# Patient Record
Sex: Female | Born: 1985 | Race: Black or African American | Hispanic: No | Marital: Single | Smoking: Never smoker
Health system: Southern US, Community
[De-identification: ages and names within clinical notes are randomized; demographics above are authoritative.]

---

## 2016-11-02 ENCOUNTER — Inpatient Hospital Stay
Admit: 2016-11-02 | Discharge: 2016-11-02 | Disposition: A | Discharge status: Home / Self Care | Payer: MEDICAID | Attending: Emergency Medicine

## 2016-11-02 DIAGNOSIS — F329 Major depressive disorder, single episode, unspecified: Secondary | ICD-10-CM

## 2016-11-02 MED ORDER — HYDROXYZINE 50 MG TAB
50 mg | ORAL_TABLET | Freq: Four times a day (QID) | ORAL | 0 refills | Status: DC | PRN
Start: 2016-11-02 — End: 2016-11-02

## 2016-11-02 MED ORDER — HYDROXYZINE 50 MG TAB
50 mg | ORAL_TABLET | Freq: Four times a day (QID) | ORAL | 0 refills | Status: AC | PRN
Start: 2016-11-02 — End: 2016-11-12

## 2016-11-02 MED ORDER — HYDROXYZINE 50 MG TAB
50 mg | ORAL | Status: AC
Start: 2016-11-02 — End: 2016-11-02
  Administered 2016-11-02: 07:00:00 via ORAL

## 2016-11-02 MED FILL — HYDROXYZINE 50 MG TAB: 50 mg | ORAL | Qty: 1

## 2016-11-02 NOTE — ED Triage Notes (Signed)
Triage: Patient arrives ambulatory from home with c/o depression and feeling sad x 1 week. Patient reports her mother has recently resurfaced in her life causing her depression to worsen. Denies SI/HI, denies ETOH/drug use.

## 2016-11-02 NOTE — Other (Signed)
Patient is 30 year old AA female reporting to ED Patient arrives ambulatory from home with c/o depression and feeling sad x 1 week. Patient reports her mother has recently resurfaced in her life causing her depression to worsen. Denies SI/HI, denies ETOH/drug use. At bedside, patient denied being suicidal/ homicidal, denied hallucinations. Patient reported previous hospitalizations in past over a year or more, denied having psychiatrist or therapist at this time. Patient reported having increased depression due to her mother coming back into her life and not being able to be with her son. Patient reported that [place] currently [place] in [place] and is suppose to have a job when she gets back. Patient reported feeling nothing is going right in her life. Patient reported having racing thoughts and needing something to help calm her. Patient does not meet criteria for hospitalization and is not seeking a hospitalizations. Patient given resources for her area with psychiatrists and therapists.

## 2016-11-02 NOTE — ED Notes (Signed)
RN addressed patient to discuss discharge instructions.  Patient became very defensive about situation.     Patient requesting to stay in room, informed patient that she can rest in the waiting room until her ride comes.
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
## 2016-11-02 NOTE — ED Notes (Signed)
Patient discharged by MD. Results and discharge instructions reviewed with the patient by MD.  Patient verbalizes understanding.  Patient discharged home, stable, ambulatory, in no acute distress.

## 2019-10-05 ENCOUNTER — Encounter (HOSPITAL_COMMUNITY): Payer: Self-pay | Admitting: Emergency Medicine

## 2019-10-05 ENCOUNTER — Emergency Department (HOSPITAL_COMMUNITY): Payer: Self-pay

## 2019-10-05 ENCOUNTER — Emergency Department (HOSPITAL_COMMUNITY)
Admission: EM | Admit: 2019-10-05 | Discharge: 2019-10-05 | Disposition: A | Discharge status: Home / Self Care | Payer: Self-pay | Attending: Emergency Medicine | Admitting: Emergency Medicine

## 2019-10-05 ENCOUNTER — Other Ambulatory Visit: Payer: Self-pay

## 2019-10-05 DIAGNOSIS — W1830XA Fall on same level, unspecified, initial encounter: Secondary | ICD-10-CM | POA: Insufficient documentation

## 2019-10-05 DIAGNOSIS — Y939 Activity, unspecified: Secondary | ICD-10-CM | POA: Insufficient documentation

## 2019-10-05 DIAGNOSIS — Y929 Unspecified place or not applicable: Secondary | ICD-10-CM | POA: Insufficient documentation

## 2019-10-05 DIAGNOSIS — S63502A Unspecified sprain of left wrist, initial encounter: Secondary | ICD-10-CM | POA: Insufficient documentation

## 2019-10-05 DIAGNOSIS — Y999 Unspecified external cause status: Secondary | ICD-10-CM | POA: Insufficient documentation

## 2019-10-05 MED ORDER — ACETAMINOPHEN 500 MG PO TABS
1000.0000 mg | ORAL_TABLET | Freq: Once | ORAL | Status: AC
  Administered 2019-10-05: 1000 mg via ORAL
  Filled 2019-10-05: qty 2

## 2019-10-05 NOTE — ED Provider Notes (Signed)
[facility]-EMERGENCY DEPT Provider Note   CSN: [id number] Arrival date & time: 10/05/19  1546     History   Chief Complaint Chief Complaint  Patient presents with  . Hand Pain    left wrist and hand  . Loss of Consciousness    HPI Crystal Gillespie is a 33 y.o. female.     The history is provided by the patient.  Hand Pain This is a new problem. The current episode started more than 2 days ago. The problem occurs daily. The problem has not changed since onset.Pertinent negatives include no chest pain, no abdominal pain, no headaches and no shortness of breath. Exacerbated by: movement  Nothing relieves the symptoms. She has tried acetaminophen for the symptoms. The treatment provided mild relief.    History reviewed. No pertinent past medical history.  There are no active problems to display for this patient.   History reviewed. No pertinent surgical history.   OB History   No obstetric history on file.      Home Medications    Prior to Admission medications   Not on File    Family History History reviewed. No pertinent family history.  Social History Social History   Tobacco Use  . Smoking status: Never Smoker  . Smokeless tobacco: Never Used  Substance Use Topics  . Alcohol use: Not Currently  . Drug use: Not Currently     Allergies   Patient has no known allergies.   Review of Systems Review of Systems  Constitutional: Negative for chills and fever.  HENT: Negative for ear pain and sore throat.   Eyes: Negative for pain and visual disturbance.  Respiratory: Negative for cough and shortness of breath.   Cardiovascular: Negative for chest pain and palpitations.  Gastrointestinal: Negative for abdominal pain and vomiting.  Genitourinary: Negative for dysuria and hematuria.  Musculoskeletal: Positive for arthralgias. Negative for back pain.  Skin: Negative for color change and rash.  Neurological: Negative for seizures,  syncope and headaches.  All other systems reviewed and are negative.    Physical Exam Updated Vital Signs BP (!) 150/104 (BP Location: Left Arm)   Pulse 97   Temp 99.4 F (37.4 C) (Oral)   Resp 18   Ht 5' 7.5" (1.715 m)   Wt 74.8 kg   LMP 10/05/2019   SpO2 100%   BMI 25.46 kg/m   Physical Exam Vitals signs and nursing note reviewed.  Constitutional:      General: She is not in acute distress.    Appearance: She is well-developed.  HENT:     Head: Normocephalic and atraumatic.  Eyes:     Conjunctiva/sclera: Conjunctivae normal.  Neck:     Musculoskeletal: Normal range of motion and neck supple.  Cardiovascular:     Rate and Rhythm: Normal rate and regular rhythm.     Heart sounds: No murmur.  Pulmonary:     Effort: Pulmonary effort is normal. No respiratory distress.     Breath sounds: Normal breath sounds.  Abdominal:     Palpations: Abdomen is soft.     Tenderness: There is no abdominal tenderness.  Musculoskeletal: Normal range of motion.        General: Tenderness (left wrist) present. No swelling, deformity or signs of injury.  Skin:    General: Skin is warm and dry.  Neurological:     General: No focal deficit present.     Mental Status: She is alert and oriented to person, place, and  time.     Cranial Nerves: No cranial nerve deficit.     Sensory: No sensory deficit.     Motor: No weakness.      ED Treatments / Results  Labs (all labs ordered are listed, but only abnormal results are displayed) Labs Reviewed - No data to display  EKG None  Radiology Dg Wrist Complete Left  Result Date: 10/05/2019 CLINICAL DATA:  Generalized left wrist pain. EXAM: LEFT WRIST - COMPLETE 3+ VIEW COMPARISON:  None. FINDINGS: There is no evidence of fracture or dislocation. There is no evidence of arthropathy or other focal bone abnormality. Soft tissues are unremarkable. IMPRESSION: Negative. Electronically Signed   By: Lajean Manes M.D.   On: 10/05/2019 17:28     Procedures Procedures (including critical care time)  Medications Ordered in ED Medications  acetaminophen (TYLENOL) tablet 1,000 mg (1,000 mg Oral Given 10/05/19 2001)     Initial Impression / Assessment and Plan / ED Course  I have reviewed the triage vital signs and the nursing notes.  Pertinent labs & imaging results that were available during my care of the patient were reviewed by me and considered in my medical decision making (see chart for details).        Crystal Gillespie is a 33 year old female here with left wrist pain after fall several days ago.  No obvious deformity.  Good pulses.  Good normal strength and sensation.  X-ray showed no acute fracture or malalignment.  Likely mild sprain.  Given Tylenol for pain.  Recommend continued use of Motrin, Tylenol, ice.  Patient also states that she is having difficulty with housing and will have social work come and discuss housing with her.  Discharged from the ED in good condition.  Given return precautions.  This chart was dictated using voice recognition software.  Despite best efforts to proofread,  errors can occur which can change the documentation meaning.    Final Clinical Impressions(s) / ED Diagnoses   Final diagnoses:  Sprain of left wrist, initial encounter    ED Discharge Orders    None       Lennice Sites, DO 10/05/19 2041

## 2019-10-05 NOTE — ED Notes (Signed)
Awaiting social worker to come and speak with pt, no respiratory or acute distress noted alert and oriented x 3 did not want her vitals taken call light in reach.
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
## 2019-10-05 NOTE — ED Notes (Addendum)
PT APOLOGIZES FOR BEHAVIOR. SHE ENDORSES IT HAS BEEN A LONG DAY. I VERBALIZED HER UNDERSTANDING AND APPRECIATED HER APOLOGY.

## 2019-10-05 NOTE — ED Triage Notes (Signed)
Writer went in to triage the patient and patient ws loud and stating that her hand hurt, Writer was charting then patient began to say that she w passed out at [facility] and wanted to know what I was going to do about it. Writer stated to the patient that WE were here to triage and gather information about why she was here. Patient began yelling and telling the writer that I did not talk plain. Patient continued to yell and when writer stated that Security was going to be called, the patient stated that she could walk herself out.

## 2019-10-05 NOTE — ED Notes (Signed)
Pt was heard screaming at Education officer, museum and states she is going to leave forget it, no respiratory or acute distress noted alert and oriented x 3 steady gait noted did not wants vitals taken.
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
## 2019-10-05 NOTE — Progress Notes (Signed)
Consult request has been received. CSW attempting to follow up at present time and gathering resources to provide to the pt.  CSW will continue to follow for D/C needs.  Alphonse Guild. Destanee Bedonie, LCSW, LCAS, CSI Transitions of Care Clinical Social Worker Care Coordination Department Ph: [phone]

## 2019-10-05 NOTE — ED Notes (Signed)
PT IS VERBALLY AGGRESSIVE TO THIS WRITER. LOUD, RAPID AND PRESSURED SPEECH. INFORMED THIS PT OF CURRENT PLAN OF CARE. I WAS INQUIRING THE STATUS OF THE CURRENT PT IN THE LOBBY. I DESIRED TO PUT A NAME AND FACE. I DESIRED TO INTRODUCE MYSELF. THIS PT CONTINUED TO INTERRUPT. I NOTICED THIS PT WELL BEFORE I CALLED HER NAME. THIS BEHAVIOR WAS NOT INITIATED BY MY PRESENCE. REGISTRATION ENDORSES THIS PT PRESENTED THIS WAY OF THE EMS. NO CHANGE FROM TIME OF ARRIVAL.  SECURITY HAS BEEN MADE AWARE. SHE CAN CALM HERSELF.

## 2019-10-07 ENCOUNTER — Emergency Department (HOSPITAL_COMMUNITY)
Admission: EM | Admit: 2019-10-07 | Discharge: 2019-10-11 | Disposition: A | Discharge status: Home / Self Care | Payer: Medicaid Other | Attending: Emergency Medicine | Admitting: Emergency Medicine

## 2019-10-07 DIAGNOSIS — F29 Unspecified psychosis not due to a substance or known physiological condition: Secondary | ICD-10-CM

## 2019-10-07 DIAGNOSIS — Z20828 Contact with and (suspected) exposure to other viral communicable diseases: Secondary | ICD-10-CM | POA: Insufficient documentation

## 2019-10-07 DIAGNOSIS — R45850 Homicidal ideations: Secondary | ICD-10-CM | POA: Insufficient documentation

## 2019-10-07 DIAGNOSIS — R45851 Suicidal ideations: Secondary | ICD-10-CM | POA: Insufficient documentation

## 2019-10-07 LAB — CBC WITH DIFFERENTIAL/PLATELET
Abs Immature Granulocytes: 0.02 10*3/uL (ref 0.00–0.07)
Basophils Absolute: 0.1 10*3/uL (ref 0.0–0.1)
Basophils Relative: 1 %
Eosinophils Absolute: 0.1 10*3/uL (ref 0.0–0.5)
Eosinophils Relative: 1 %
HCT: 39.3 % (ref 36.0–46.0)
Hemoglobin: 12.6 g/dL (ref 12.0–15.0)
Immature Granulocytes: 0 %
Lymphocytes Relative: 27 %
Lymphs Abs: 1.7 10*3/uL (ref 0.7–4.0)
MCH: 27.6 pg (ref 26.0–34.0)
MCHC: 32.1 g/dL (ref 30.0–36.0)
MCV: 86 fL (ref 80.0–100.0)
Monocytes Absolute: 0.5 10*3/uL (ref 0.1–1.0)
Monocytes Relative: 8 %
Neutro Abs: 4 10*3/uL (ref 1.7–7.7)
Neutrophils Relative %: 63 %
Platelets: 230 10*3/uL (ref 150–400)
RBC: 4.57 MIL/uL (ref 3.87–5.11)
RDW: 14.5 % (ref 11.5–15.5)
WBC: 6.4 10*3/uL (ref 4.0–10.5)
nRBC: 0 % (ref 0.0–0.2)

## 2019-10-07 LAB — I-STAT BETA HCG BLOOD, ED (MC, WL, AP ONLY): I-stat hCG, quantitative: <5 m[IU]/mL (ref <5)

## 2019-10-07 LAB — COMPREHENSIVE METABOLIC PANEL
ALT: 20 U/L (ref 0–44)
AST: 24 U/L (ref 15–41)
Albumin: 4.5 g/dL (ref 3.5–5.0)
Alkaline Phosphatase: 48 U/L (ref 38–126)
Anion gap: 12 (ref 5–15)
BUN: 8 mg/dL (ref 6–20)
CO2: 22 mmol/L (ref 22–32)
Calcium: 9.3 mg/dL (ref 8.9–10.3)
Chloride: 105 mmol/L (ref 98–111)
Creatinine, Ser: 0.93 mg/dL (ref 0.44–1.00)
GFR calc Af Amer: >60 mL/min (ref >60)
GFR calc non Af Amer: >60 mL/min (ref >60)
Glucose, Bld: 89 mg/dL (ref 70–99)
Potassium: 3.1 mmol/L — ABNORMAL LOW (ref 3.5–5.1)
Sodium: 139 mmol/L (ref 135–145)
Total Bilirubin: 0.9 mg/dL (ref 0.3–1.2)
Total Protein: 7.8 g/dL (ref 6.5–8.1)

## 2019-10-07 LAB — ETHANOL: Alcohol, Ethyl (B): <10 mg/dL (ref <10)

## 2019-10-07 LAB — LITHIUM LEVEL: Lithium Lvl: <0.06 mmol/L — ABNORMAL LOW (ref 0.60–1.20)

## 2019-10-07 MED ORDER — HALOPERIDOL LACTATE 5 MG/ML IJ SOLN
5.0000 mg | Freq: Once | INTRAMUSCULAR | Status: AC
  Administered 2019-10-07: 20:00:00 5 mg via INTRAMUSCULAR
  Filled 2019-10-07: qty 1

## 2019-10-07 MED ORDER — HALOPERIDOL 5 MG PO TABS
5.0000 mg | ORAL_TABLET | Freq: Once | ORAL | Status: AC
  Administered 2019-10-07: 19:00:00 5 mg via ORAL
  Filled 2019-10-07: qty 1

## 2019-10-07 MED ORDER — LORAZEPAM 2 MG/ML IJ SOLN
2.0000 mg | Freq: Once | INTRAMUSCULAR | Status: AC
  Administered 2019-10-07: 20:00:00 2 mg via INTRAMUSCULAR
  Filled 2019-10-07: qty 1

## 2019-10-07 MED ORDER — BENZTROPINE MESYLATE 1 MG/ML IJ SOLN: 2.0000 mg | Freq: Once | INTRAMUSCULAR | Status: DC

## 2019-10-07 MED ORDER — DIPHENHYDRAMINE HCL 50 MG/ML IJ SOLN
50.0000 mg | Freq: Once | INTRAMUSCULAR | Status: AC
  Administered 2019-10-07: 20:00:00 50 mg via INTRAMUSCULAR
  Filled 2019-10-07: qty 1

## 2019-10-07 MED ORDER — BENZTROPINE MESYLATE 1 MG PO TABS
2.0000 mg | ORAL_TABLET | Freq: Once | ORAL | Status: AC
  Administered 2019-10-07: 19:00:00 2 mg via ORAL
  Filled 2019-10-07: qty 2

## 2019-10-07 NOTE — ED Notes (Signed)
This patient is in hallway screaming " I am a fucking mental patient,  I can do what I want"

## 2019-10-07 NOTE — [facility] Counselor (Signed)
TTS clinician attempted to do assessment with Pt. Pt's nurse reported that patient can not do assessment due to being medicated earlier and is now sleeping. TTS will try to assess again upon nurse call.

## 2019-10-07 NOTE — ED Triage Notes (Signed)
Patient is here via EMS after police were called out to a hotel she was getting kicked out of.  She was in police car when she stated that she wanted to go to the hospital.   EMS brought her here and told me she had been very labile and possibly manic.  She does appear labile upon arrival.

## 2019-10-07 NOTE — ED Notes (Addendum)
Pt is loud and rude and inappropriate.  She has made threatening gestures toward a very elderly patient.
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
## 2019-10-08 DIAGNOSIS — F29 Unspecified psychosis not due to a substance or known physiological condition: Secondary | ICD-10-CM

## 2019-10-08 LAB — SARS CORONAVIRUS 2 BY RT PCR (HOSPITAL ORDER, PERFORMED IN ~~LOC~~ HOSPITAL LAB): SARS Coronavirus 2: NEGATIVE

## 2019-10-08 MED ORDER — OLANZAPINE 5 MG PO TBDP
5.0000 mg | ORAL_TABLET | Freq: Two times a day (BID) | ORAL | Status: DC
  Administered 2019-10-08: 19:00:00 5 mg via ORAL
  Filled 2019-10-08 (×2): qty 1

## 2019-10-08 MED ORDER — ZIPRASIDONE MESYLATE 20 MG IM SOLR
10.0000 mg | Freq: Once | INTRAMUSCULAR | Status: AC
  Administered 2019-10-08: 20:00:00 10 mg via INTRAMUSCULAR
  Filled 2019-10-08: qty 20

## 2019-10-08 MED ORDER — LITHIUM CARBONATE ER 300 MG PO TBCR
300.0000 mg | EXTENDED_RELEASE_TABLET | Freq: Two times a day (BID) | ORAL | Status: DC
  Administered 2019-10-08 – 2019-10-11 (×7): 300 mg via ORAL
  Filled 2019-10-08 (×8): qty 1

## 2019-10-08 MED ORDER — POTASSIUM CHLORIDE CRYS ER 20 MEQ PO TBCR: 40.0000 meq | EXTENDED_RELEASE_TABLET | Freq: Once | ORAL | Status: DC

## 2019-10-08 MED ORDER — HALOPERIDOL LACTATE 5 MG/ML IJ SOLN
5.0000 mg | Freq: Once | INTRAMUSCULAR | Status: AC
  Administered 2019-10-08: 5 mg via INTRAMUSCULAR
  Filled 2019-10-08: qty 1

## 2019-10-08 MED ORDER — DIPHENHYDRAMINE HCL 50 MG/ML IJ SOLN
50.0000 mg | Freq: Once | INTRAMUSCULAR | Status: AC
  Administered 2019-10-08: 13:00:00 50 mg via INTRAMUSCULAR
  Filled 2019-10-08: qty 1

## 2019-10-08 MED ORDER — LORAZEPAM 2 MG/ML IJ SOLN
1.0000 mg | Freq: Four times a day (QID) | INTRAMUSCULAR | Status: DC | PRN
  Administered 2019-10-08 – 2019-10-09 (×2): 1 mg via INTRAMUSCULAR
  Filled 2019-10-08 (×2): qty 1

## 2019-10-08 MED ORDER — STERILE WATER FOR INJECTION IJ SOLN
INTRAMUSCULAR | Status: AC
  Administered 2019-10-08: 21:00:00
  Filled 2019-10-08: qty 10

## 2019-10-08 MED ORDER — STERILE WATER FOR INJECTION IJ SOLN
INTRAMUSCULAR | Status: AC
  Administered 2019-10-08: 08:00:00
  Filled 2019-10-08: qty 10

## 2019-10-08 MED ORDER — LORAZEPAM 2 MG/ML IJ SOLN
2.0000 mg | Freq: Once | INTRAMUSCULAR | Status: AC
  Administered 2019-10-08: 13:00:00 2 mg via INTRAMUSCULAR
  Filled 2019-10-08: qty 1

## 2019-10-08 MED ORDER — ZIPRASIDONE MESYLATE 20 MG IM SOLR
20.0000 mg | Freq: Once | INTRAMUSCULAR | Status: AC
  Administered 2019-10-08: 08:00:00 20 mg via INTRAMUSCULAR
  Filled 2019-10-08: qty 20

## 2019-10-08 NOTE — ED Notes (Signed)
Pt does not appear to understand her situation or her options. Placed in seclusion for her safety and the safety of the milieu. Informed of criteria to get out of seclusion and pt does not register understanding. Required staff for medication administration to hold her down and she was kicking out randomly.

## 2019-10-08 NOTE — BH Assessment (Signed)
TTS spoke with Crystal Gillespie. Pt is still sleeping unable to do assessment at this time still. Will assess once pt awakes.

## 2019-10-08 NOTE — ED Notes (Signed)
Pt will not tolerate Covid test at this time.

## 2019-10-08 NOTE — ED Notes (Signed)
While in seclusion room pt tried to choke herself with her clothing that she removed. Screaming, yelling, kicking. Released from seclusion and evaluated for need for four-point restraints. Holding off on that for now as pt has improved ability to maintain her behavior within acceptable limits. Injections were given while pt was in seclusion and they appear to be having a calming effect. Unable to obtain labs, Covid test or VS because pt is violently uncooperative.

## 2019-10-08 NOTE — Consult Note (Signed)
Telepsych Consultation   Reason for Consult:  Agitated, combative behaviors Referring Physician:  Arthor Captain, PA-c Location of Patient: [facility] Location of Provider: [facility]  Patient Identification: Crystal Gillespie MRN:  [id number] Principal Diagnosis: Psychotic disorder [facility]) Diagnosis:  Principal Problem:   Psychotic disorder (HCC)   Total Time spent with patient: 30 minutes  Subjective:   Crystal Gillespie is a 33 y.o. female patient seen in [facility] after presenting via EMS from a hotel where was getting kicked out of.  HPI:  As per TTS assessment note, the pt is an 33 y.o. female that presents this date with IVC. Per IVC patient has a history of psychosis and Bipolar brought in by [employer]. Patient is observed to be highly agitated at the time of assessment and renders limited information. Patient denies any S/I or AVH although reports active H/I stating she "wants to kill everybody." Patient will not elaborate on content of statement.  Security was present as this Clinical research associate attempted to assess with patient observed to be crying and making threats to staff. Patient is difficult to redirect and keeps leaving the room crying. Patient is difficult to understand due to her crying. Per notes patient is brought in by police after being evicted from a hotel. She has a past medical history of bipolar and psychosis. It is unclear if patient is currently receiving any OP services although patient denies being on any medications. It is unclear if patient is processing the content of this writer's questions and is screaming "No" to every question. Patient has not been willing to participate in aninterview. She is fixated on one of the nurses who she keeps threatening. The patient is exhibiting labile agitated behavior and continues to physically threaten staff on arrival. Patient per history has been seen in the past for cocaine and cannabis abuse although denies at this time with UDS pending. Patient  initially presented with a history of bipolar 1 disorder with manic and psychotic features who presented earlier to the ED for evaluation of hallucinations, delusions and aggressive behaviors. Patient was brought to the ED by [employer] after displaying aggressive behavior to the hotel manager. Patient was yelling, cursing, threatened staff and was agitated while in the ED requiring physical restraints and got Haldol Benadryl and Ativan. Patient has been sexually inappropriate and exposing her genitals and rubbing. She was per her notes that she was talking to go forward and that they will and responding to internal stimuli. Patient denies all the allegations associated with IVC. Patient will not answer most of the questions associated with assessment.    Upon assessment this morning, pt was fast asleep as she had just received an IM dose of Geodon for combative behaviors. She was disruptive and was banging the windows in the protected area. She received one dose of IM Haldol, Ativan and Benadryl yesterday soon after arrival. As per nursing staff, she has been refusing COVID test. She has also not given a urine sample for urine analysis and urine toxicology.   Past Psychiatric History: unclear psychiatric history  Risk to Self: unable to assess Risk to Others: unable to assess. Pt was threatening towards everyone upon arrival. Prior Inpatient Therapy: Prior Inpatient Therapy: (Unknown) Prior Outpatient Therapy: Prior Outpatient Therapy: ([facility])  Past Medical History: No past medical history on file. No past surgical history on file. Family History: No family history on file. Family Psychiatric  History: unknown Social History:  Social History   Substance and Sexual Activity  Alcohol Use Not  Currently     Social History   Substance and Sexual Activity  Drug Use Not Currently    Social History   Socioeconomic History  . Marital status: Single    Spouse name: Not on file  . Number of children:  Not on file  . Years of education: Not on file  . Highest education level: Not on file  Occupational History  . Not on file  Social Needs  . Financial resource strain: Not on file  . Food insecurity    Worry: Not on file    Inability: Not on file  . Transportation needs    Medical: Not on file    Non-medical: Not on file  Tobacco Use  . Smoking status: Never Smoker  . Smokeless tobacco: Never Used  Substance and Sexual Activity  . Alcohol use: Not Currently  . Drug use: Not Currently  . Sexual activity: Not on file  Lifestyle  . Physical activity    Days per week: Not on file    Minutes per session: Not on file  . Stress: Not on file  Relationships  . Social Musicianconnections    Talks on phone: Not on file    Gets together: Not on file    Attends religious service: Not on file    Active member of club or organization: Not on file    Attends meetings of clubs or organizations: Not on file    Relationship status: Not on file  Other Topics Concern  . Not on file  Social History Narrative  . Not on file   Additional Social History:    Allergies:  No Known Allergies  Labs:  Results for orders placed or performed during the hospital encounter of 10/07/19 (from the past 48 hour(s))  I-Stat beta hCG blood, ED     Status: None   Collection Time: 10/07/19  6:02 PM  Result Value Ref Range   I-stat hCG, quantitative <5.0 <5 mIU/mL   Comment 3            Comment:   GEST. AGE      CONC.  (mIU/mL)   <=1 WEEK        5 - 50     2 WEEKS       50 - 500     3 WEEKS       100 - 10,000     4 WEEKS     1,000 - 30,000        FEMALE AND NON-PREGNANT FEMALE:     LESS THAN 5 mIU/mL   Comprehensive metabolic panel     Status: Abnormal   Collection Time: 10/07/19  6:11 PM  Result Value Ref Range   Sodium 139 135 - 145 mmol/L   Potassium 3.1 (L) 3.5 - 5.1 mmol/L   Chloride 105 98 - 111 mmol/L   CO2 22 22 - 32 mmol/L   Glucose, Bld 89 70 - 99 mg/dL   BUN 8 6 - 20 mg/dL   Creatinine, Ser  1.610.93 0.44 - 1.00 mg/dL   Calcium 9.3 8.9 - 09.610.3 mg/dL   Total Protein 7.8 6.5 - 8.1 g/dL   Albumin 4.5 3.5 - 5.0 g/dL   AST 24 15 - 41 U/L   ALT 20 0 - 44 U/L   Alkaline Phosphatase 48 38 - 126 U/L   Total Bilirubin 0.9 0.3 - 1.2 mg/dL   GFR calc non Af Amer >60 >60 mL/min   GFR calc Af Amer >  60 >60 mL/min   Anion gap 12 5 - 15    Comment: Performed at [facility], [address], [place], [place] [address]  Ethanol     Status: None   Collection Time: 10/07/19  6:11 PM  Result Value Ref Range   Alcohol, Ethyl (B) <10 <10 mg/dL    Comment: (NOTE) Lowest detectable limit for serum alcohol is 10 mg/dL. For medical purposes only. Performed at [facility], [address], [place], [place] [address]   CBC with Diff     Status: None   Collection Time: 10/07/19  6:11 PM  Result Value Ref Range   WBC 6.4 4.0 - 10.5 K/uL   RBC 4.57 3.87 - 5.11 MIL/uL   Hemoglobin 12.6 12.0 - 15.0 g/dL   HCT 82.9 56.2 - 13.0 %   MCV 86.0 80.0 - 100.0 fL   MCH 27.6 26.0 - 34.0 pg   MCHC 32.1 30.0 - 36.0 g/dL   RDW 86.5 78.4 - 69.6 %   Platelets 230 150 - 400 K/uL   nRBC 0.0 0.0 - 0.2 %   Neutrophils Relative % 63 %   Neutro Abs 4.0 1.7 - 7.7 K/uL   Lymphocytes Relative 27 %   Lymphs Abs 1.7 0.7 - 4.0 K/uL   Monocytes Relative 8 %   Monocytes Absolute 0.5 0.1 - 1.0 K/uL   Eosinophils Relative 1 %   Eosinophils Absolute 0.1 0.0 - 0.5 K/uL   Basophils Relative 1 %   Basophils Absolute 0.1 0.0 - 0.1 K/uL   Immature Granulocytes 0 %   Abs Immature Granulocytes 0.02 0.00 - 0.07 K/uL    Comment: Performed at [facility], [address], [place], [place] [address]  Lithium level     Status: Abnormal   Collection Time: 10/07/19  6:11 PM  Result Value Ref Range   Lithium Lvl <0.06 (L) 0.60 - 1.20 mmol/L    Comment: REPEATED TO VERIFY Performed at [facility], [address], [place], [place] [address]     Medications:   Current Facility-Administered Medications  Medication Dose Route Frequency Provider Last Rate Last Dose  . potassium chloride SA (KLOR-CON) CR tablet 40 mEq  40 mEq Oral Once Arthor Captain, PA-C       No current outpatient medications on file.    Musculoskeletal: Strength & Muscle Tone: Pt asleep, unable to assess Gait & Station: Pt asleep, unable to assess Patient leans: Pt asleep, unable to assess  Psychiatric Specialty Exam: Physical Exam  Nursing note and vitals reviewed. Constitutional: She appears well-developed.  HENT:  Head: Atraumatic.     Review of Systems  Musculoskeletal: Negative for myalgias.   Pt asleep, unable to perform  Height 5' 7.5" (1.715 m), last menstrual period 10/05/2019.Body mass index is 25.46 kg/m.  General Appearance: Disheveled  Eye Contact:  Poor, pt asleep  Speech:  Pt asleep, unable to assess  Volume:  Pt asleep, unable to assess  Mood:  Pt asleep, unable to assess  Affect:  Pt asleep, unable to assess  Thought Process:  NA, Pt asleep, unable to assess  Orientation:  Pt asleep, unable to assess  Thought Content:  Pt asleep, unable to assess  Suicidal Thoughts:  Pt asleep, unable to assess  Homicidal Thoughts:  Pt asleep, unable to assess  Memory:  Pt asleep, unable to assess  Judgement:  Pt asleep, unable to assess  Insight:  Pt asleep, unable to assess  Psychomotor  Activity:  Pt asleep, unable to assess  Concentration:  Pt asleep, unable to assess  Recall:  Pt asleep, unable to assess  Fund of Knowledge:  Pt asleep, unable to assess  Language:  Pt asleep, unable to assess  Akathisia:  Pt asleep, unable to assess  Handed:  Pt asleep, unable to assess  AIMS (if indicated):     Assets:  Pt asleep, unable to assess  ADL's:  Pt asleep, unable to assess  Cognition:  Pt asleep, unable to assess  Sleep:    Pt asleep, unable to assess     Treatment Plan Summary: Assessment and Plan: 33 year old female with unclear psychiatric history  now present in the ED after being brought in by the [employer] for aggressive behaviors in a hotel. Since arrival to the ED, pt has been aggressive and uncooperative with the staff. She has needed 2 doses of IM emergency medications. She is refusing COVID testing. She was asleep at the time of psychiatric assessment as she had just received IM medications. Based on her presentation and assessment by other providers and nursing staff, pt needs psychiatric stabilization and will benefit from in-patient psychiatric admission.  Disposition: Recommend psychiatric Inpatient admission when medically cleared.  SW Ms. Clarise Cruz was informed regarding this plan and she has started working on bed placement in an appropriate facility.  This service was provided via telemedicine using a 2-way, interactive audio and video technology.  Names of all persons participating in this telemedicine service and their role in this encounter. Name: [place] Crane, MD  Role: Psychiatrist  Name: Primus Bravo  Role: Patient  Name: Ms. Jerline Pain, PMHNP Role: Co-provider       [place] Crane, MD 10/08/2019 12:20 PM

## 2019-10-08 NOTE — ED Provider Notes (Signed)
  Physical Exam  Ht 5' 7.5" (1.715 m)   LMP 10/05/2019   BMI 25.46 kg/m   Physical Exam  ED Course/Procedures     .Critical Care Performed by: Varney Biles, MD Authorized by: Varney Biles, MD   Critical care provider statement:    Critical care time (minutes):  32   Critical care was time spent personally by me on the following activities:  Discussions with consultants, evaluation of patient's response to treatment, examination of patient, ordering and performing treatments and interventions, ordering and review of laboratory studies, ordering and review of radiographic studies, pulse oximetry, re-evaluation of patient's condition, obtaining history from patient or surrogate and review of old charts    MDM    33 year old with history of psychosis and bipolar disorder became acutely disruptive.  She started banging on the windows in the protected area.  When I went to reassess her she had calmed down a little bit and speaking with an Garment/textile technologist.  She was emotionally labile and crying.  She informs me that she is gone through a lot of trauma in her life and just wants to talk to people who care.  Shanon Brow from our psych team was also available and started assessing the patient. Ultimately she was refusing to take oral medications that might help her calm down and started getting agitated 1 more time.  We will order IM Geodon for her protection and that of our staff.  She had received Haldol, Ativan and Benadryl yesterday.  She might require more medications.     Varney Biles, MD 10/08/19 [phone]

## 2019-10-08 NOTE — Progress Notes (Signed)
Received Crystal Gillespie at shift change screaming to the top of her voice and making threats. She was medicated with haldol, benadryl and ativan with security assistance. Shortly thereafter she drifted off to sleep and slept throughout the night without further incident.

## 2019-10-08 NOTE — ED Notes (Signed)
Verbally unpleasant, refusing to follow directions, defiant. Repeatedly pulling the doors off the bathroom and throwing them on the floor. Crying and saying no one cares about her. Writer attempted to engage her in ways she and we could help her calm down and refocus, she did ask for her TV channel to be changed and writer did, but came out of her room immediately and continued behaviors. Security called to assist with injection which order was given per Dr Vanita Panda. She was verbally uncooperative but did sit still for both injections. States she hates me, she already took medicine today, and wanted another person to give her the shot. While she was getting the injection yelling to me to " hurry hurry, your not even pushing anything in" Security officer speaking with her now, trying to help calm her down.

## 2019-10-08 NOTE — Progress Notes (Signed)
Pt meets inpatient criteria. Referral information has been sent to the following hospitals for review:   [place]  [facility]      Disposition will continue to follow for inpatient placement needs.   Audree Camel, LCSW, [place] Disposition [place] [facility] [phone]

## 2019-10-08 NOTE — Progress Notes (Signed)
Pt has been denied admission at [facility] due to acuity of behaviors. [facility] continues to review pt but will need pt to test negative for Covid before being transported.   Audree Camel, LCSW, [place] Disposition [facility]/TTS [phone]

## 2019-10-08 NOTE — ED Notes (Signed)
Woke up and was again violent towards objects, banging on the window, pulled the magnet doors down again, pulled the laundry cabinet door off. She did allow security and facilities to remove the door without attacking them. She has not attacked anyone, just objects and yells and screams. Offered po medication which she refused. After talking to someone on the telephone she was able to gain some control and she approached this Probation officer and asked for her Lithium and Zyprexa. She took them. This writer heated her dinner tray up per her request. Currently calm and more appropriate.

## 2019-10-08 NOTE — ED Notes (Signed)
Pt woke up yelling, "Leave me alone, banging on the glass, slamming the telephone receiver against the wall, tearing the magnet doors off the doors of the bathroom.

## 2019-10-08 NOTE — ED Notes (Signed)
Took awhile for her to get sleepy and go to bed. Appeared to be fighting sleep, she was up several times, hit the nurse call light once. Asked for water but when writer brought it to her she was too sleepy to drink it. Sitting on edge of be with her eyes closed. Tried to encourage her to lie down but remained sitting. She eventually laid down on her own and is sleeping now.

## 2019-10-08 NOTE — ED Notes (Signed)
Pt woke up from sleeping all night (according to night shift) and began crying, yelling, screaming, pulling down the bathroom doors (which are held up by magnets) and banging on the nurses station window.  EDP assessed her face to face. Waiting for orders to help pt calm down.
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
## 2019-10-09 ENCOUNTER — Other Ambulatory Visit: Payer: Self-pay

## 2019-10-09 LAB — URINALYSIS, COMPLETE (UACMP) WITH MICROSCOPIC
Bilirubin Urine: NEGATIVE
Glucose, UA: NEGATIVE mg/dL
Hgb urine dipstick: NEGATIVE
Ketones, ur: NEGATIVE mg/dL
Leukocytes,Ua: NEGATIVE
Nitrite: POSITIVE — AB
Protein, ur: NEGATIVE mg/dL
Specific Gravity, Urine: 1.016 (ref 1.005–1.030)
pH: 6 (ref 5.0–8.0)

## 2019-10-09 LAB — RAPID URINE DRUG SCREEN, HOSP PERFORMED
Amphetamines: NOT DETECTED
Barbiturates: NOT DETECTED
Benzodiazepines: POSITIVE — AB
Cocaine: NOT DETECTED
Opiates: NOT DETECTED
Tetrahydrocannabinol: POSITIVE — AB

## 2019-10-09 MED ORDER — DIPHENHYDRAMINE HCL 25 MG PO CAPS
50.0000 mg | ORAL_CAPSULE | Freq: Four times a day (QID) | ORAL | Status: DC | PRN
  Administered 2019-10-09 – 2019-10-11 (×4): 50 mg via ORAL
  Filled 2019-10-09 (×4): qty 2

## 2019-10-09 MED ORDER — LORAZEPAM 1 MG PO TABS
2.0000 mg | ORAL_TABLET | Freq: Four times a day (QID) | ORAL | Status: DC | PRN
  Administered 2019-10-09 – 2019-10-11 (×3): 2 mg via ORAL
  Filled 2019-10-09 (×4): qty 2

## 2019-10-09 MED ORDER — HALOPERIDOL 5 MG PO TABS
5.0000 mg | ORAL_TABLET | Freq: Four times a day (QID) | ORAL | Status: DC | PRN
  Administered 2019-10-09 (×2): 5 mg via ORAL
  Filled 2019-10-09 (×2): qty 1

## 2019-10-09 MED ORDER — ZIPRASIDONE MESYLATE 20 MG IM SOLR
20.0000 mg | Freq: Once | INTRAMUSCULAR | Status: AC | PRN
  Administered 2019-10-10: 07:00:00 20 mg via INTRAMUSCULAR
  Filled 2019-10-09: qty 20

## 2019-10-09 MED ORDER — ZIPRASIDONE MESYLATE 20 MG IM SOLR
10.0000 mg | Freq: Once | INTRAMUSCULAR | Status: AC
  Administered 2019-10-09: 04:00:00 10 mg via INTRAMUSCULAR

## 2019-10-09 MED ORDER — DIPHENHYDRAMINE HCL 50 MG/ML IJ SOLN: 50.0000 mg | Freq: Four times a day (QID) | INTRAMUSCULAR | Status: DC | PRN

## 2019-10-09 MED ORDER — LORAZEPAM 2 MG/ML IJ SOLN
2.0000 mg | Freq: Four times a day (QID) | INTRAMUSCULAR | Status: DC | PRN
  Administered 2019-10-10: 07:00:00 2 mg via INTRAMUSCULAR
  Filled 2019-10-09: qty 1

## 2019-10-09 MED ORDER — ZIPRASIDONE MESYLATE 20 MG IM SOLR
INTRAMUSCULAR | Status: AC
  Filled 2019-10-09: qty 20

## 2019-10-09 MED ORDER — HALOPERIDOL LACTATE 5 MG/ML IJ SOLN: 5.0000 mg | Freq: Four times a day (QID) | INTRAMUSCULAR | Status: DC | PRN

## 2019-10-09 MED ORDER — LORAZEPAM 1 MG PO TABS: 1.0000 mg | ORAL_TABLET | Freq: Four times a day (QID) | ORAL | Status: DC | PRN

## 2019-10-09 NOTE — ED Notes (Signed)
Up in hall walking/talking w/ [facility]
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
## 2019-10-09 NOTE — ED Notes (Signed)
On the phone 

## 2019-10-09 NOTE — ED Notes (Signed)
Pt medicated w/o difficulty, up in hall walking, redirectable.

## 2019-10-09 NOTE — ED Notes (Addendum)
Pt up in hall angry, yelling, cursing at this writer "get out of my face...." then began   Talking quielty w/ mHt in hall.

## 2019-10-09 NOTE — ED Notes (Signed)
Sitting quieltly in room eating supper

## 2019-10-09 NOTE — ED Notes (Signed)
Sleeping. Quiet, breathing regular and unlabored.

## 2019-10-09 NOTE — ED Notes (Addendum)
When the pt was told to end her call and the phone was turned of she became angry,yelling, cursing, pulling the magnetetic doors off, crying at times, kicking the doors on the floor. Pt unable to calm, not redirectable. Pt medicated w/o difficulty, security present.  Pt remained angry with this Probation officer.

## 2019-10-09 NOTE — ED Notes (Signed)
Back to her room.

## 2019-10-09 NOTE — ED Notes (Signed)
Patient refused vitals Rn Darryll Capers is aware of refusal

## 2019-10-09 NOTE — ED Notes (Signed)
Talking w/ Crystal Gillespie

## 2019-10-09 NOTE — ED Notes (Signed)
Pt in room, NP attempted to eval via telepsych.  Pt would not talk to her "I want to be left alone...don't want to talk to ya'll...don't know what brought me here..." Pt then walked off. Pt angry, becoming louder and more vocal, security present.

## 2019-10-09 NOTE — ED Notes (Addendum)
Pt became angry after phone call.  Redirectable back to her room, began to curse and hit her pillow on the chair.  Pt would stop hitting the chair when told to stop.  Pt angry, but agreed to take medications PO, medicated w/ minimal difficulty. Security present.

## 2019-10-09 NOTE — ED Notes (Signed)
Up walking in the hall, quiet, redirectable

## 2019-10-09 NOTE — ED Notes (Signed)
Up to the bathroom 

## 2019-10-09 NOTE — ED Notes (Signed)
Awake, angry affect. Speaks with staff but with a bored attitude and monitone voice when asking for something like a comb this am. She is awake now and asking for something for sleep. She is restless, in and out of room and to station for various requests.
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
## 2019-10-09 NOTE — Progress Notes (Addendum)
Per Marvia Pickles, NP pt needs to be referred to [facility] due to acuity. CSW completed referral and obtained [place] #: [id number] that is valid from 10/09/2019-10/15/2019. Referral faxed to [facility] for review--CSW spoke with [facility] 10/09/2019 12:19 PM to complete telephone intake and confirm receipt of referral.   Etoile Looman S. Ouida Sills, MSW, LCSW Clinical Social Worker 10/09/2019 11:55 AM

## 2019-10-09 NOTE — ED Notes (Signed)
Calmer, polite, redirectable.

## 2019-10-09 NOTE — ED Notes (Addendum)
Up to the  Desk talking to TTS, calm, polite

## 2019-10-09 NOTE — ED Notes (Addendum)
Pt sister Verdis Frederickson) reports that her father died a couple months ago, and this set her off.  She reports t hat the pt will take medications while in the the hospital, but when dc'd she will stop taking them.  She reports that she does not have anyplace to live also

## 2019-10-09 NOTE — ED Notes (Signed)
On the phone, nad.  

## 2019-10-09 NOTE — ED Notes (Signed)
Talking quielty on the phone

## 2019-10-09 NOTE — ED Notes (Signed)
Up in hall talking quielty w/ mHt.

## 2019-10-09 NOTE — ED Notes (Signed)
Pt A&O x 4, talking on phone at present, calm & cooperative at present.  Pt labile.  Q 15 min checks in effect.  Monitoring for safety.

## 2019-10-10 MED ORDER — ACETAMINOPHEN 325 MG PO TABS
650.0000 mg | ORAL_TABLET | Freq: Once | ORAL | Status: AC
  Administered 2019-10-10: 15:00:00 650 mg via ORAL
  Filled 2019-10-10: qty 2

## 2019-10-10 MED ORDER — STERILE WATER FOR INJECTION IJ SOLN
INTRAMUSCULAR | Status: AC
  Administered 2019-10-10: 07:00:00 10 mL
  Filled 2019-10-10: qty 10

## 2019-10-10 NOTE — ED Notes (Addendum)
Pt awake, cussing out staff, demanding names of security with threats to get them fired. Calling them "bitch, I hope you loose your job, fat fuck, you have crumbs between your balls, stupid fuck, you stink." You want to keep on going?"  She is standing in the widow calling them "Rent a Cob, that is all you will ever do."
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
## 2019-10-10 NOTE — ED Notes (Signed)
Pt awake with increasing agitation, pt requested Benadryl to help her get sleep.   Pt continues to request phone to be turned on for her use at 0500am in the morning.  Staff explained to pt that phone privileges start at 10 am.
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
## 2019-10-10 NOTE — ED Notes (Signed)
This Probation officer, after taking report, went to pt's room with night shift nurses and pt was in there tying her pants around her neck. Required cutting them off with scissors because she would not stop.  Medication, given by nights, was also beginning to work so pt was allowed out of seclusion because it helped her to stop that self-destructive behavior.

## 2019-10-10 NOTE — ED Notes (Signed)
Currently in bed sleeping

## 2019-10-10 NOTE — ED Notes (Signed)
Pt alert, but drowsy from medications that were give by night shift. Breakfast given.
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
## 2019-10-10 NOTE — ED Notes (Signed)
Pt pouring water and soda into Nurses Station, pt redirected back to room, continues to come to [employer].

## 2019-10-10 NOTE — Consult Note (Addendum)
Telepsych Consultation   Reason for Consult:  Aggressive and assaultive bahvior  Referring Physician:  EDP Location of Patient: [facility] Provider[facility]  Patient Identification: Crystal Gillespie MRN:  [id number] Principal Diagnosis: Psychotic disorder [facility](HCC) Diagnosis:  Principal Problem:   Psychotic disorder (HCC)   Total Time spent with patient: 15 minutes  Subjective:   Crystal Gillespie is a 33 y.o. female patient admitted with involuntary commitment after eviction form local hotel. Patient continues to refuse to participate in assessment. Patient observed to be tearful, yelling and inappropriately removing clothing in hallway area of secure unit. Staff attempting to redirect patient, patient refuses to cooperate.  Attempted to encourage patient to complete assessment, patient walks away from tele-psychiatry monitor.   HPI:  Per TTS assessment: patient has a history of psychosis and Bipolar brought in by [employer]. Patient is observed to be highly agitated at the time of assessment and renders limited information. Patient denies any S/I or AVH although reports active H/I stating she "wants to kill everybody."  Approximately three hours after initial attempted assessment, patient requests to speak with this Clinical research associatewriter. Patient seated and speaking in low, clear tone. Patient verbalizes "I want to get out of here.I am only going to take Lithium, I haven't taken it in years but I will take it when I leave, I am not going to take any other medications." Patient reports having 31-year-old son who currently resides, "with family." Patient denies suicidal and homicidal ideations. Ruminates on "the police have treated me rough and handcuffed me!" Patient states, "this place (Emergency department) is gloomy, I have never felt suicidal before in my life until I got here." Patient denies plan or intent to harm self, "If I was going to kill myself I would not do it  here."  Offered patient support and encouragement.   Past Psychiatric History: Unknown  Risk to Self: Suicidal Ideation: No Suicidal Intent: No Is patient at risk for suicide?: No, but patient needs Medical Clearance Suicidal Plan?: No Access to Means: No What has been your use of drugs/alcohol within the last 12 months?: NA How many times?: 0 Other Self Harm Risks: (SA issues) Triggers for Past Attempts: (NA) Intentional Self Injurious Behavior: None Risk to Others: Homicidal Ideation: Yes-Currently Present Thoughts of Harm to Others: Yes-Currently Present Comment - Thoughts of Harm to Others: Pt voices that she will "kill everyone" Current Homicidal Intent: Yes-Currently Present Current Homicidal Plan: (Pt does not voice plan) Access to Homicidal Means: No Identified Victim: Staff  History of harm to others?: Yes Assessment of Violence: On admission Violent Behavior Description: Increased physical aggression Does patient have access to weapons?: No Criminal Charges Pending?: No Does patient have a court date: No Prior Inpatient Therapy: Prior Inpatient Therapy: (Unknown) Prior Outpatient Therapy: Prior Outpatient Therapy: ([facility])  Past Medical History: No past medical history on file. No past surgical history on file. Family History: No family history on file. Family Psychiatric  History: Unknown Social History:  Social History   Substance and Sexual Activity  Alcohol Use Not Currently     Social History   Substance and Sexual Activity  Drug Use Not Currently    Social History   Socioeconomic History  . Marital status: Single    Spouse name: Not on file  . Number of children: Not on file  . Years of education: Not on file  . Highest education level: Not on file  Occupational History  . Not on file  Social Needs  . Financial resource strain: Not on file  . Food insecurity    Worry: Not on file    Inability: Not on file  . Transportation needs    Medical:  Not on file    Non-medical: Not on file  Tobacco Use  . Smoking status: Never Smoker  . Smokeless tobacco: Never Used  Substance and Sexual Activity  . Alcohol use: Not Currently  . Drug use: Not Currently  . Sexual activity: Not on file  Lifestyle  . Physical activity    Days per week: Not on file    Minutes per session: Not on file  . Stress: Not on file  Relationships  . Social Herbalist on phone: Not on file    Gets together: Not on file    Attends religious service: Not on file    Active member of club or organization: Not on file    Attends meetings of clubs or organizations: Not on file    Relationship status: Not on file  Other Topics Concern  . Not on file  Social History Narrative  . Not on file   Additional Social History:    Allergies:  No Known Allergies  Labs:  Results for orders placed or performed during the hospital encounter of 10/07/19 (from the past 48 hour(s))  SARS Coronavirus 2 by RT PCR (hospital order, performed in [facility] hospital lab) Nasopharyngeal Nasopharyngeal Swab     Status: None   Collection Time: 10/08/19  3:00 PM   Specimen: Nasopharyngeal Swab  Result Value Ref Range   SARS Coronavirus 2 NEGATIVE NEGATIVE    Comment: (NOTE) If result is NEGATIVE SARS-CoV-2 target nucleic acids are NOT DETECTED. The SARS-CoV-2 RNA is generally detectable in upper and lower  respiratory specimens during the acute phase of infection. The lowest  concentration of SARS-CoV-2 viral copies this assay can detect is 250  copies / mL. A negative result does not preclude SARS-CoV-2 infection  and should not be used as the sole basis for treatment or other  patient management decisions.  A negative result may occur with  improper specimen collection / handling, submission of specimen other  than nasopharyngeal swab, presence of viral mutation(s) within the  areas targeted by this assay, and inadequate number of viral copies  (<250 copies /  mL). A negative result must be combined with clinical  observations, patient history, and epidemiological information. If result is POSITIVE SARS-CoV-2 target nucleic acids are DETECTED. The SARS-CoV-2 RNA is generally detectable in upper and lower  respiratory specimens dur ing the acute phase of infection.  Positive  results are indicative of active infection with SARS-CoV-2.  Clinical  correlation with patient history and other diagnostic information is  necessary to determine patient infection status.  Positive results do  not rule out bacterial infection or co-infection with other viruses. If result is PRESUMPTIVE POSTIVE SARS-CoV-2 nucleic acids MAY BE PRESENT.   A presumptive positive result was obtained on the submitted specimen  and confirmed on repeat testing.  While 2019 novel coronavirus  (SARS-CoV-2) nucleic acids may be present in the submitted sample  additional confirmatory testing may be necessary for epidemiological  and / or clinical management purposes  to differentiate between  SARS-CoV-2 and other Sarbecovirus currently known to infect humans.  If clinically indicated additional testing with an alternate test  methodology [phone]) is advised. The SARS-CoV-2 RNA is generally  detectable in upper and lower respiratory sp ecimens during the  acute  phase of infection. The expected result is Negative. Fact Sheet for Patients:  BoilerBrush.com.cy Fact Sheet for Healthcare Providers: https://pope.com/ This test is not yet approved or cleared by the [place] FDA and has been authorized for detection and/or diagnosis of SARS-CoV-2 by FDA under an Emergency Use Authorization (EUA).  This EUA will remain in effect (meaning this test can be used) for the duration of the COVID-19 declaration under Section 564(b)(1) of the Act, 21 U.S.C. section 360bbb-3(b)(1), unless the authorization is terminated or revoked  sooner. Performed at [facility], [address] [place], [place] [address]   Urinalysis, Complete w Microscopic     Status: Abnormal   Collection Time: 10/09/19 11:26 AM  Result Value Ref Range   Color, Urine YELLOW YELLOW   APPearance CLEAR CLEAR   Specific Gravity, Urine 1.016 1.005 - 1.030   pH 6.0 5.0 - 8.0   Glucose, UA NEGATIVE NEGATIVE mg/dL   Hgb urine dipstick NEGATIVE NEGATIVE   Bilirubin Urine NEGATIVE NEGATIVE   Ketones, ur NEGATIVE NEGATIVE mg/dL   Protein, ur NEGATIVE NEGATIVE mg/dL   Nitrite POSITIVE (A) NEGATIVE   Leukocytes,Ua NEGATIVE NEGATIVE   RBC / HPF 0-5 0 - 5 RBC/hpf   WBC, UA 0-5 0 - 5 WBC/hpf   Bacteria, UA FEW (A) NONE SEEN   Squamous Epithelial / LPF 0-5 0 - 5   Mucus PRESENT     Comment: Performed at [facility], [address], [place], [place] [address]  Urine rapid drug screen (hosp performed)     Status: Abnormal   Collection Time: 10/09/19 11:27 AM  Result Value Ref Range   Opiates NONE DETECTED NONE DETECTED   Cocaine NONE DETECTED NONE DETECTED   Benzodiazepines POSITIVE (A) NONE DETECTED   Amphetamines NONE DETECTED NONE DETECTED   Tetrahydrocannabinol POSITIVE (A) NONE DETECTED   Barbiturates NONE DETECTED NONE DETECTED    Comment: (NOTE) DRUG SCREEN FOR MEDICAL PURPOSES ONLY.  IF CONFIRMATION IS NEEDED FOR ANY PURPOSE, NOTIFY LAB WITHIN 5 DAYS. LOWEST DETECTABLE LIMITS FOR URINE DRUG SCREEN Drug Class                     Cutoff (ng/mL) Amphetamine and metabolites    1000 Barbiturate and metabolites    200 Benzodiazepine                 200 Tricyclics and metabolites     300 Opiates and metabolites        300 Cocaine and metabolites        300 THC                            50 Performed at [facility], [address] [place], [place] [address]     Medications:  Current Facility-Administered Medications  Medication Dose Route Frequency Provider Last Rate Last Dose   . diphenhydrAMINE (BENADRYL) capsule 50 mg  50 mg Oral Q6H PRN Patrcia Dolly, FNP   50 mg at 10/10/19 0524   Or  . diphenhydrAMINE (BENADRYL) injection 50 mg  50 mg Intramuscular Q6H PRN Patrcia Dolly, FNP      . haloperidol (HALDOL) tablet 5 mg  5 mg Oral Q6H PRN Patrcia Dolly, FNP   5 mg at 10/09/19 1814   Or  . haloperidol lactate (HALDOL) injection 5 mg  5 mg Intramuscular Q6H PRN Patrcia Dolly, FNP      .  lithium carbonate (LITHOBID) CR tablet 300 mg  300 mg Oral Q12H Laveda Abbe, NP   300 mg at 10/09/19 2109  . LORazepam (ATIVAN) tablet 2 mg  2 mg Oral Q6H PRN Patrcia Dolly, FNP   2 mg at 10/09/19 1814   Or  . LORazepam (ATIVAN) injection 2 mg  2 mg Intramuscular Q6H PRN Patrcia Dolly, FNP   2 mg at 10/10/19 0646  . potassium chloride SA (KLOR-CON) CR tablet 40 mEq  40 mEq Oral Once Arthor Captain, PA-C       No current outpatient medications on file.    Musculoskeletal: Strength & Muscle Tone: within normal limits Gait & Station: normal Patient leans: N/A  Psychiatric Specialty Exam: Physical Exam  Neurological: Coordination abnormal.    ROS  Blood pressure 91/64, pulse 91, temperature 98 F (36.7 C), temperature source Oral, resp. rate 18, height 5' 7.5" (1.715 m), last menstrual period 10/05/2019, SpO2 100 %.Body mass index is 25.46 kg/m.  General Appearance: Disheveled  Eye Contact:  Poor  Speech:  Pressured  Volume:  Increased  Mood:  Irritable  Affect:  Labile  Thought Process:  Disorganized and Descriptions of Associations: Loose  Orientation:  Other:  unable to assess  Thought Content:  Paranoid Ideation and Tangential  Suicidal Thoughts:  unable to assess  Homicidal Thoughts:  unable to assess  Memory:  unable to assess  Judgement:  Impaired  Insight:  Lacking  Psychomotor Activity:  Normal  Concentration:  Concentration: unable to assess  Recall:  unable to assess  Fund of Knowledge:  unable to assess  Language:  Fair  Akathisia:  unable to assess   Handed:  Right  AIMS (if indicated):     Assets:  Physical Health  ADL's:  Intact  Cognition:  Impaired,  Moderate  Sleep:        Treatment Plan Summary: Daily contact with patient to assess and evaluate symptoms and progress in treatment  Disposition: Recommend psychiatric Inpatient admission when medically cleared.  This service was provided via telemedicine using a 2-way, interactive audio and video technology.  Names of all persons participating in this telemedicine service and their role in this encounter. Name: Crystal Gillespie Role: Patient  Name: Berneice Heinrich Role: FNP    Patrcia Dolly, FNP 10/10/2019 12:02 PM

## 2019-10-10 NOTE — ED Notes (Signed)
Patient is at the desk requesting to speak with doctor she is cursing at the writer about not turning on the phones it was explained to her that we could not turn the phones on at this time we would like for our clients to rest at this time. She is constantly threatening me

## 2019-10-10 NOTE — ED Notes (Signed)
Pt standing at [employer] with [employer], requesting that he take her phone number.

## 2019-10-10 NOTE — Progress Notes (Signed)
Charles with [facility] requests updated information along with ED notes faxed to [phone] for continued review.  Lind Covert, MSW, LCSW Therapeutic Triage Specialist  [phone]

## 2019-10-10 NOTE — ED Notes (Signed)
Pt A&O x 4, no distress noted, calm at present.  Talking on the phone at present.  Q 15 min checks in effect, monitoring for safety.

## 2019-10-11 ENCOUNTER — Encounter (HOSPITAL_COMMUNITY): Payer: Self-pay | Admitting: Registered Nurse

## 2019-10-11 NOTE — Discharge Instructions (Signed)
For your behavioral health needs, you are advised to follow up with your current outpatient provider.  If you do not have a provider, contact one of the agencies listed below at your earliest opportunity to arrange for an intake appointment:       [facility], [place] [address]      [phone]      New patients are seen at their walk-in clinic.  Walk-in hours are Monday - Friday from 8:30 am - 12:00 pm, and from 1:00 pm - 2:30 pm.  Walk-in patients are seen on a first come, first served basis, so try to arrive as early as possible for the best chance of being seen the same day.       [facility]      [address]      [place], [place] [address]      [phone]      Crisis number: [phone]

## 2019-10-11 NOTE — BHH Counselor (Signed)
[place] contacted [facility] regarding patient. This counselor informed [facility] that patient still needed placement.

## 2019-10-11 NOTE — Consult Note (Signed)
[facility] Psych ED Discharge  10/11/2019 12:36 PM Crystal Gillespie  MRN:  [id number] Principal Problem: Psychotic disorder [facility]) Discharge Diagnoses: Principal Problem:   Psychotic disorder [facility])   Subjective: Crystal Gillespie, 33 y.o., female patient seen via tele psych by this provider, Dr. Lucianne Muss; and chart reviewed on 10/11/19.  On evaluation Crystal Gillespie reports patient states that she is feeling much better and "I don't know why I'm still here in the hospital.  I am fine.  I'm not hearing voices and I don't want to hurt myself or anybody else.  No I'm not paranoia."  Patient states that she needs to get back to her job.  States that her sister is supportive "and she has been there with me every step of the way"   During evaluation Crystal Gillespie is alert/oriented x 4; calm/cooperative; and mood is congruent with affect.  She does not appear to be responding to internal/external stimuli or delusional thoughts.  Patient denies suicidal/self-harm/homicidal ideation, psychosis, and paranoia.  Patient answered question appropriately.     Total Time spent with patient: 30 minutes  Past Psychiatric History: Denies prior psychiatric history  Past Medical History: History reviewed. No pertinent past medical history. History reviewed. No pertinent surgical history. Family History: History reviewed. No pertinent family history. Family Psychiatric  History: Denies Social History:  Social History   Substance and Sexual Activity  Alcohol Use Not Currently     Social History   Substance and Sexual Activity  Drug Use Not Currently    Social History   Socioeconomic History  . Marital status: Single    Spouse name: Not on file  . Number of children: Not on file  . Years of education: Not on file  . Highest education level: Not on file  Occupational History  . Not on file  Social Needs  . Financial resource strain: Not on file  . Food insecurity    Worry: Not on file    Inability: Not on file  .  Transportation needs    Medical: Not on file    Non-medical: Not on file  Tobacco Use  . Smoking status: Never Smoker  . Smokeless tobacco: Never Used  Substance and Sexual Activity  . Alcohol use: Not Currently  . Drug use: Not Currently  . Sexual activity: Not on file  Lifestyle  . Physical activity    Days per week: Not on file    Minutes per session: Not on file  . Stress: Not on file  Relationships  . Social Musician on phone: Not on file    Gets together: Not on file    Attends religious service: Not on file    Active member of club or organization: Not on file    Attends meetings of clubs or organizations: Not on file    Relationship status: Not on file  Other Topics Concern  . Not on file  Social History Narrative  . Not on file    Has this patient used any form of tobacco in the last 30 days? (Cigarettes, Smokeless Tobacco, Cigars, and/or Pipes) Prescription not provided because: Patient does not use tobacco products  Current Medications: Current Facility-Administered Medications  Medication Dose Route Frequency Provider Last Rate Last Dose  . diphenhydrAMINE (BENADRYL) capsule 50 mg  50 mg Oral Q6H PRN Patrcia Dolly, FNP   50 mg at 10/11/19 0241   Or  . diphenhydrAMINE (BENADRYL) injection 50 mg  50 mg Intramuscular Q6H PRN Berneice Heinrich  L, FNP      . haloperidol (HALDOL) tablet 5 mg  5 mg Oral Q6H PRN Patrcia Dollyate, Tina L, FNP   5 mg at 10/09/19 1814   Or  . haloperidol lactate (HALDOL) injection 5 mg  5 mg Intramuscular Q6H PRN Patrcia Dollyate, Tina L, FNP      . lithium carbonate (LITHOBID) CR tablet 300 mg  300 mg Oral Q12H Laveda AbbeParks, Laurie Britton, NP   300 mg at 10/11/19 1045  . LORazepam (ATIVAN) tablet 2 mg  2 mg Oral Q6H PRN Patrcia Dollyate, Tina L, FNP   2 mg at 10/11/19 [id number]   Or  . LORazepam (ATIVAN) injection 2 mg  2 mg Intramuscular Q6H PRN Patrcia Dollyate, Tina L, FNP   2 mg at 10/10/19 0646  . potassium chloride SA (KLOR-CON) CR tablet 40 mEq  40 mEq Oral Once Arthor CaptainHarris, Abigail, PA-C        No current outpatient medications on file.   PTA Medications: (Not in a hospital admission)   Musculoskeletal: Strength & Muscle Tone: within normal limits Gait & Station: normal Patient leans: N/A  Psychiatric Specialty Exam: Physical Exam  Nursing note and vitals reviewed. Constitutional: She is oriented to person, place, and time. No distress.  Neck: Normal range of motion.  Respiratory: Effort normal.  Musculoskeletal: Normal range of motion.  Neurological: She is oriented to person, place, and time.  Psychiatric: She has a normal mood and affect. Her speech is normal and behavior is normal. Judgment and thought content normal. Cognition and memory are normal.    Review of Systems  Psychiatric/Behavioral: Depression: Stable. Hallucinations: Denies. Memory loss: Denies. Suicidal ideas: Denies. Nervous/anxious: Denies. Insomnia: Denies.   All other systems reviewed and are negative.   Blood pressure (!) 129/98, pulse (!) 102, temperature 98 F (36.7 C), temperature source Oral, resp. rate 18, height 5' 7.5" (1.715 m), last menstrual period 10/05/2019, SpO2 96 %.Body mass index is 25.46 kg/m.  General Appearance: Casual  Eye Contact:  Good  Speech:  Clear and Coherent and Normal Rate  Volume:  Normal  Mood:  "Fine" Appropriate  Affect:  Appropriate and Congruent  Thought Process:  Coherent and Goal Directed  Orientation:  Full (Time, Place, and Person)  Thought Content:  WDL  Suicidal Thoughts:  No  Homicidal Thoughts:  No  Memory:  Immediate;   Good Recent;   Good  Judgement:  Intact  Insight:  Present  Psychomotor Activity:  Normal  Concentration:  Concentration: Good and Attention Span: Good  Recall:  Good  Fund of Knowledge:  Good  Language:  Good  Akathisia:  No  Handed:  Right  AIMS (if indicated):     Assets:  Communication Skills Desire for Improvement Housing Physical Health Resilience Social Support  ADL's:  Intact  Cognition:  WNL  Sleep:         Demographic Factors:  NA  Loss Factors: NA  Historical Factors: Impulsivity  Risk Reduction Factors:   Responsible for children under 31 years of age, Sense of responsibility to family, Living with another person, especially a relative and Positive social support  Continued Clinical Symptoms:  Alcohol/Substance Abuse/Dependencies  Cognitive Features That Contribute To Risk:  None    Suicide Risk:  Minimal: No identifiable suicidal ideation.  Patients presenting with no risk factors but with morbid ruminations; may be classified as minimal risk based on the severity of the depressive symptoms    Plan Of Care/Follow-up recommendations:  Activity:  As tolerated Diet:  Heart healthy  Disposition: No evidence of imminent risk to self or others at present.   Patient does not meet criteria for psychiatric inpatient admission. Supportive therapy provided about ongoing stressors. Discussed crisis plan, support from social network, calling 911, coming to the Emergency Department, and calling Suicide Hotline.   , NP 10/11/2019, 12:36 PM

## 2019-10-11 NOTE — BH Assessment (Signed)
[facility] Assessment Progress Note  Per Hampton Abbot, MD, this pt does not require psychiatric hospitalization at this time.  Pt presents under IVC initiated by EDP Shirlyn Goltz, MD, which Dr Dwyane Dee has rescinded.  Pt is to be discharged from [facility] with referral information for outpatient behavioral health providers.  Discharge instructions include information for [facility], and for [facility].  Pt's nurse, Nena Jordan, has been notified.  Jalene Mullet, [place] Triage Specialist [phone]

## 2019-10-11 NOTE — ED Notes (Signed)
Pt punching Velcro doors off both bathrooms, demanding to use the phone. Demeaning to staff.

## 2019-10-11 NOTE — ED Notes (Signed)
Discharge instructions were reviewed.  [employer] came to arrest patient at discharge so she left with them safely and in no distress.  All belongings were returned to patient.

## 2019-11-23 ENCOUNTER — Other Ambulatory Visit: Payer: Self-pay

## 2019-11-23 DIAGNOSIS — Z20822 Contact with and (suspected) exposure to covid-19: Secondary | ICD-10-CM

## 2019-11-24 LAB — NOVEL CORONAVIRUS, NAA: SARS-CoV-2, NAA: NOT DETECTED

## 2019-11-27 ENCOUNTER — Telehealth: Payer: Self-pay

## 2019-11-27 NOTE — Telephone Encounter (Signed)
Called and informed patient that test for Covid 19 was NEGATIVE. Discussed signs and symptoms of Covid 19 : fever, chills, respiratory symptoms, cough, ENT symptoms, sore throat, SOB, muscle pain, diarrhea, headache, loss of taste/smell, close exposure to COVID-19 patient. Pt instructed to call PCP if they develop the above signs and sx. Pt also instructed to call 911 if having respiratory issues/distress. Discussed MyChart enrollment. Pt verbalized understanding.  

## 2021-05-10 IMAGING — CR DG WRIST COMPLETE 3+V*L*
4 series · 4 of 4 positions shown · non-contrast
Comparison: None.

CLINICAL DATA: Generalized left wrist pain.

EXAM:
LEFT WRIST - COMPLETE 3+ VIEW

[x wrist pa left]
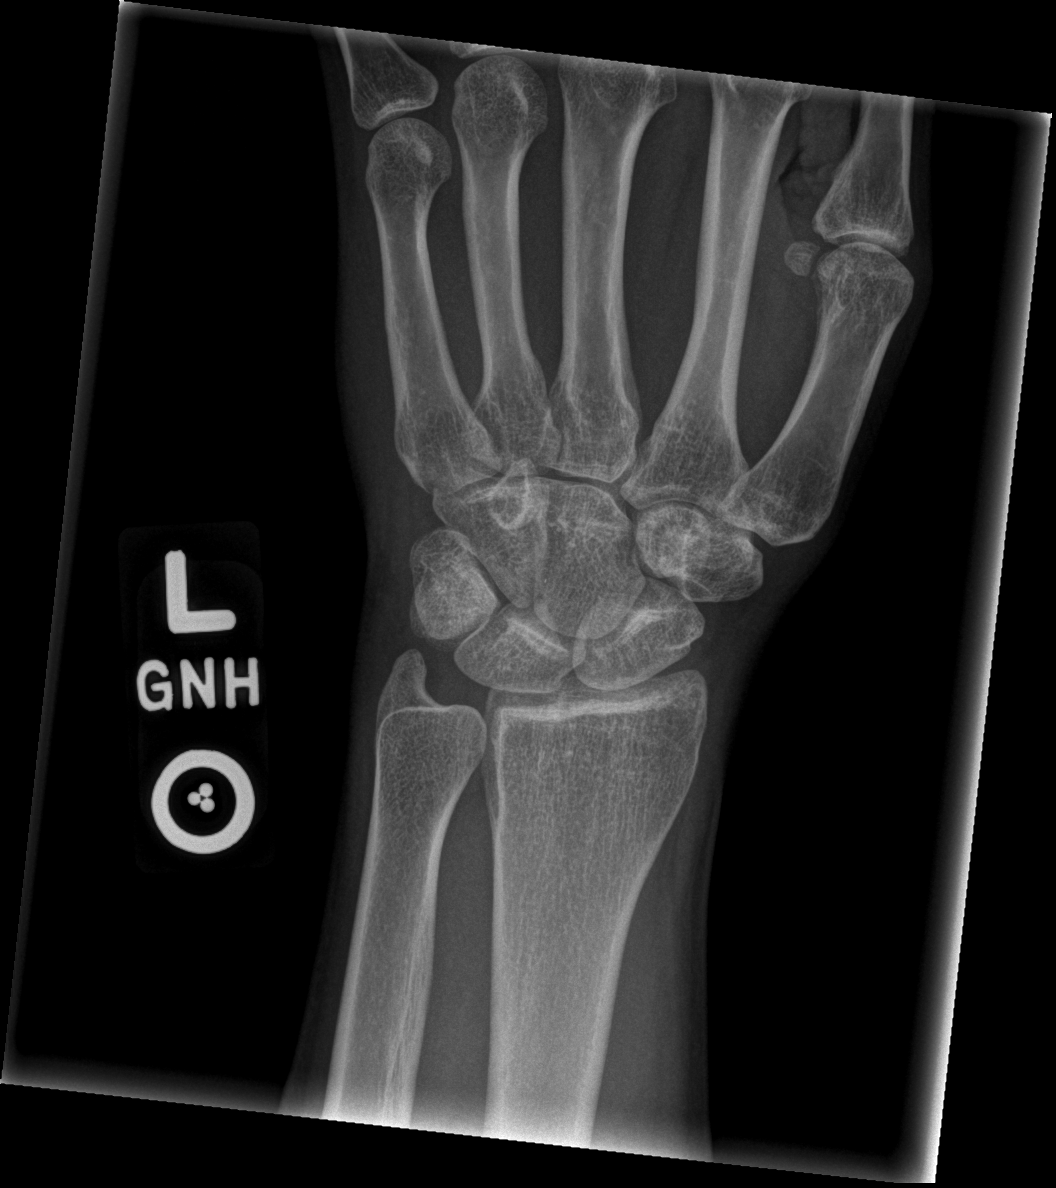

[x wrist obl left]
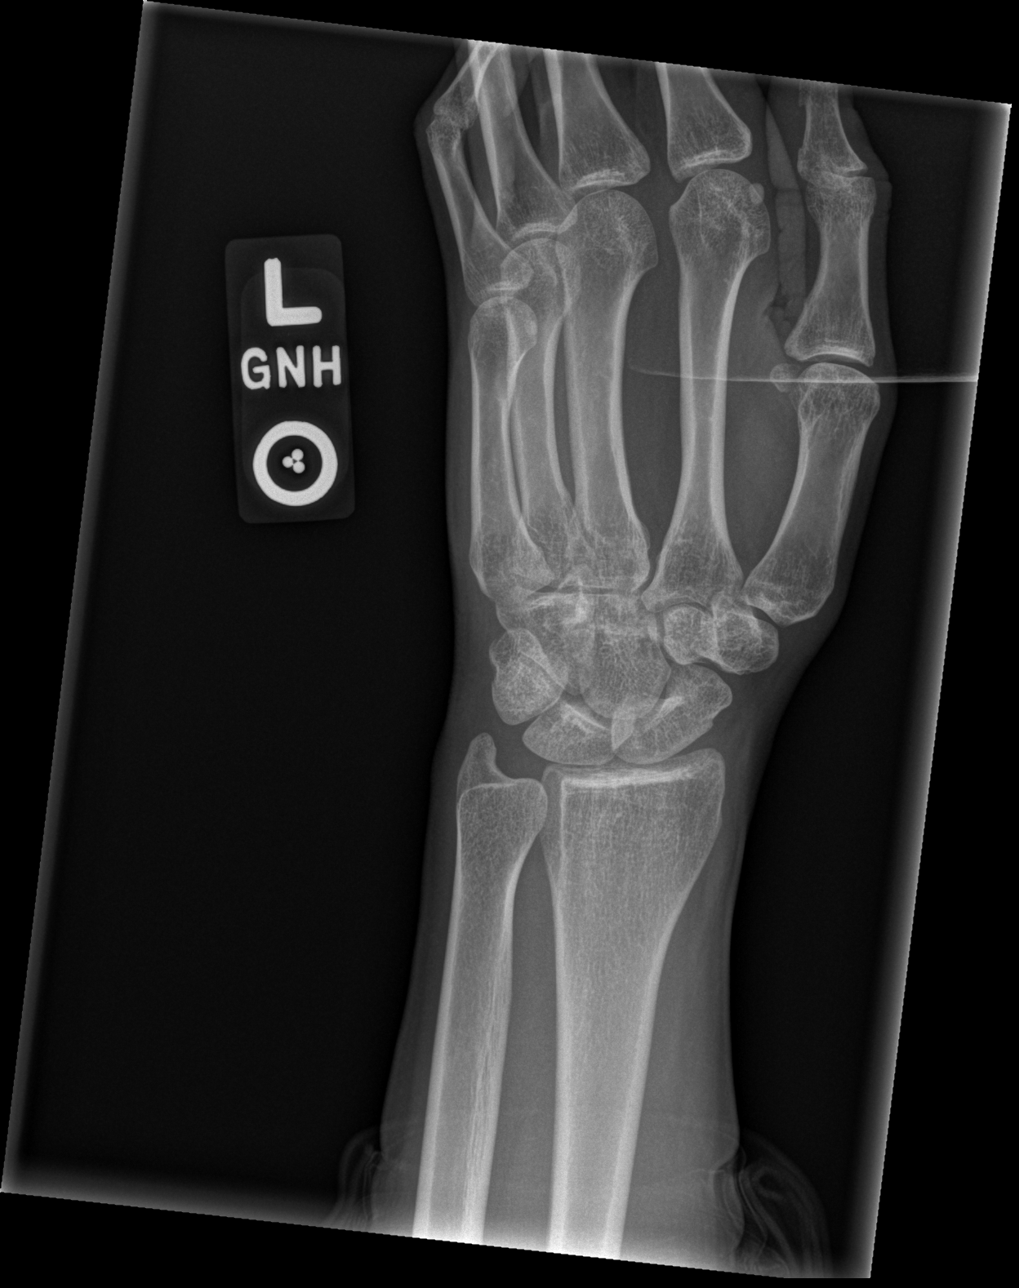

[x wrist lat left]
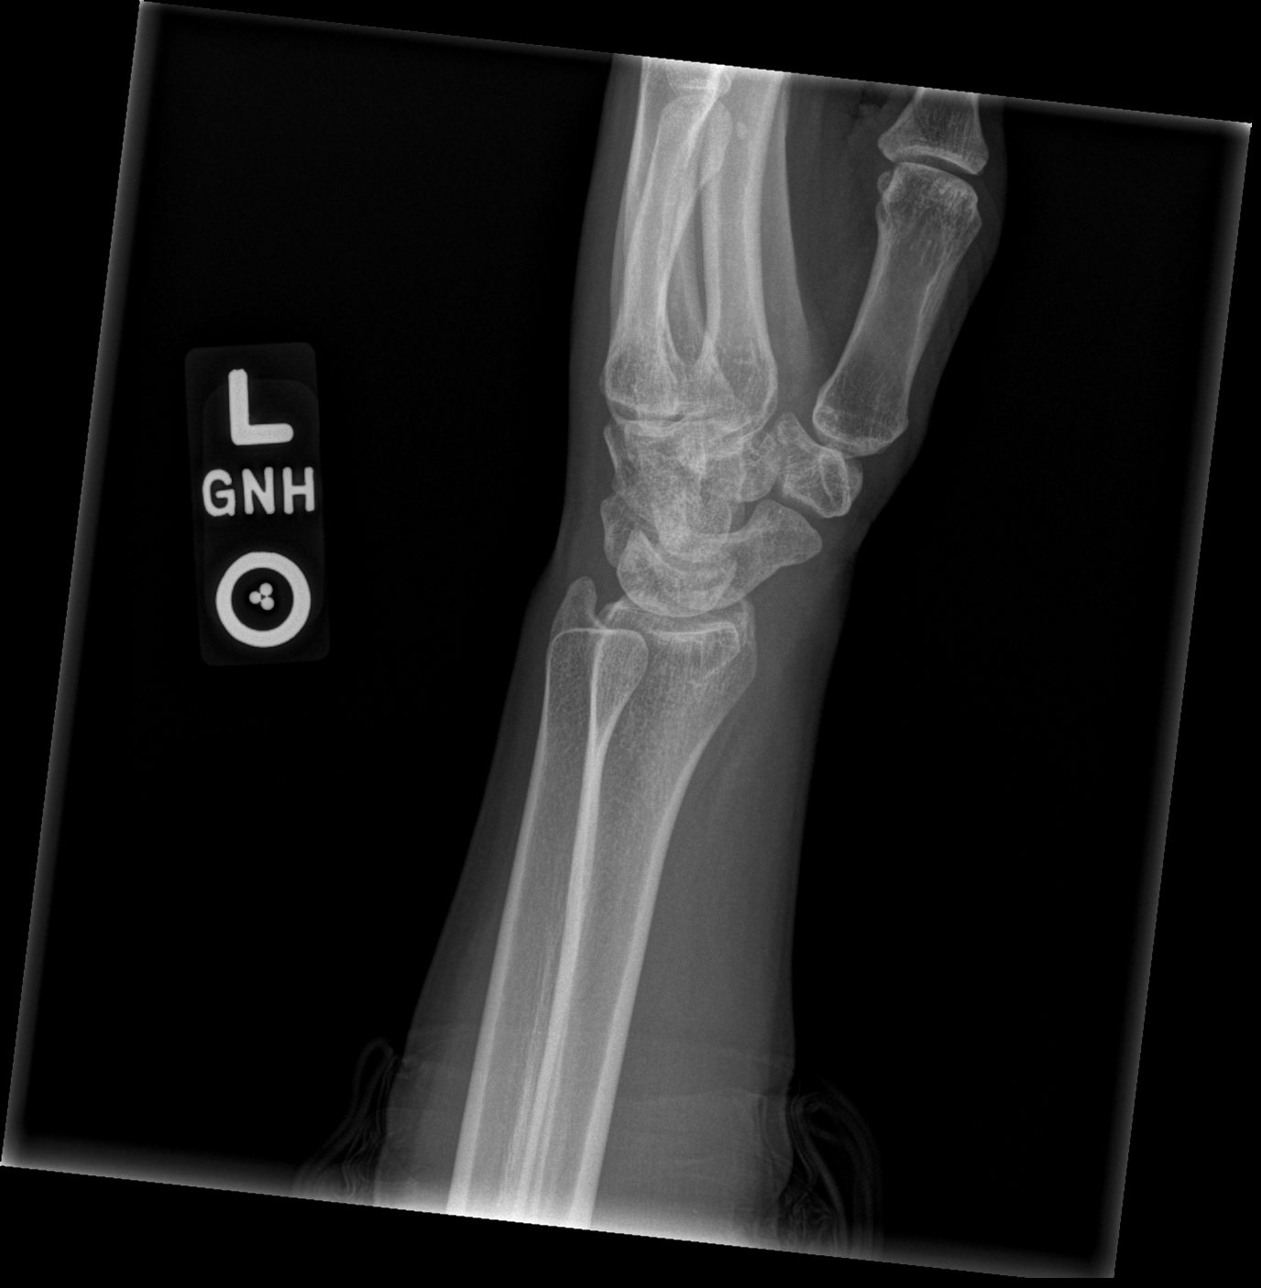

[x wrist navicular view left]
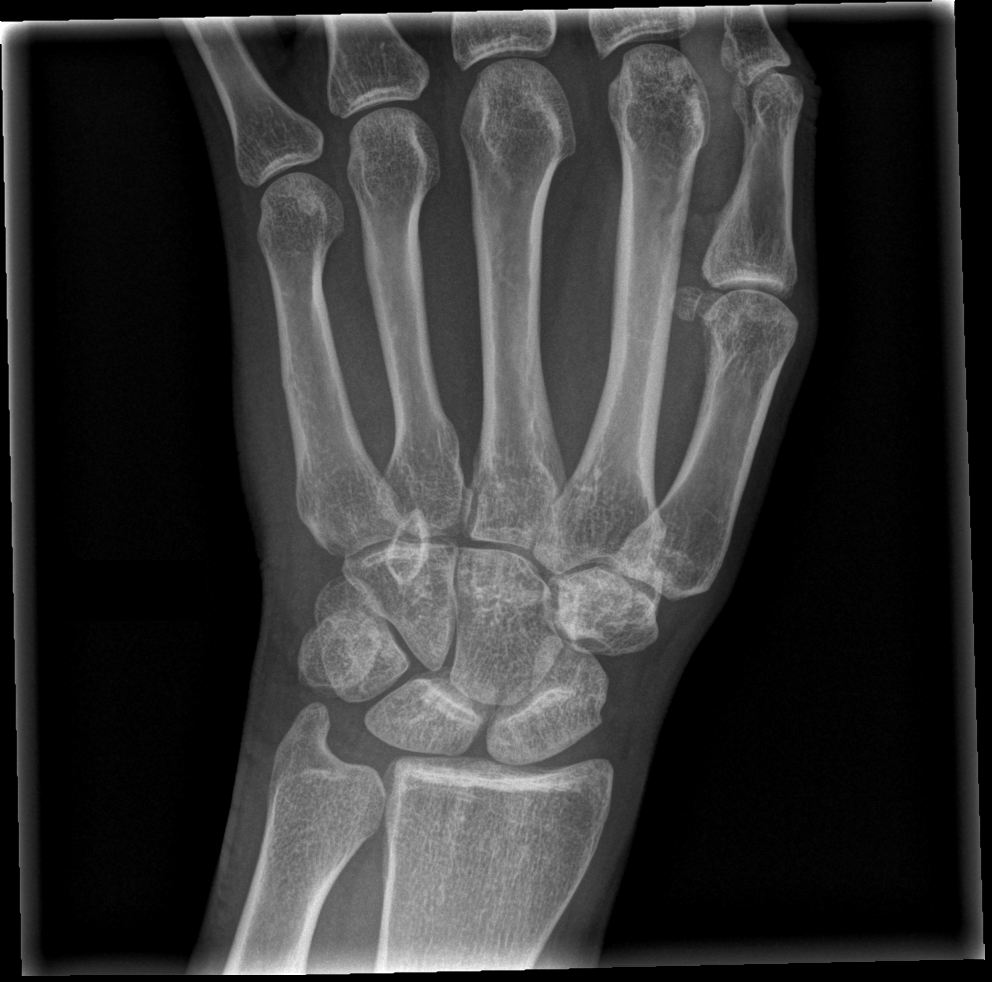

[4 of 4 positions shown; findings below may reference images not displayed]

FINDINGS: There is no evidence of fracture or dislocation. There is no
evidence of arthropathy or other focal bone abnormality. Soft
tissues are unremarkable.
IMPRESSION: Negative.
